# Patient Record
Sex: Male | Born: 1960 | Race: White | Hispanic: No | Marital: Married
Health system: Southern US, Community
[De-identification: ages and names within clinical notes are randomized; demographics above are authoritative.]

---

## 2018-04-27 ENCOUNTER — Encounter (HOSPITAL_COMMUNITY): Payer: Self-pay | Admitting: Internal Medicine

## 2018-04-27 ENCOUNTER — Emergency Department (HOSPITAL_COMMUNITY): Payer: BLUE CROSS/BLUE SHIELD

## 2018-04-27 ENCOUNTER — Emergency Department (HOSPITAL_COMMUNITY)
Admission: EM | Admit: 2018-04-27 | Discharge: 2018-04-27 | Disposition: A | Discharge status: Home / Self Care | Payer: BLUE CROSS/BLUE SHIELD | Attending: Emergency Medicine | Admitting: Emergency Medicine

## 2018-04-27 DIAGNOSIS — R21 Rash and other nonspecific skin eruption: Secondary | ICD-10-CM | POA: Diagnosis not present

## 2018-04-27 DIAGNOSIS — M79671 Pain in right foot: Secondary | ICD-10-CM | POA: Diagnosis present

## 2018-04-27 MED ORDER — INDOMETHACIN 50 MG PO CAPS: 50.0000 mg | ORAL_CAPSULE | Freq: Two times a day (BID) | ORAL | 0 refills | Status: AC

## 2018-04-27 MED ORDER — DOXYCYCLINE HYCLATE 100 MG PO CAPS: 100.0000 mg | ORAL_CAPSULE | Freq: Two times a day (BID) | ORAL | 0 refills | Status: AC

## 2018-04-27 MED ORDER — PREDNISONE 20 MG PO TABS: ORAL_TABLET | ORAL | 0 refills | Status: AC

## 2018-04-27 NOTE — ED Notes (Signed)
Patient transported to X-ray 

## 2018-04-27 NOTE — ED Triage Notes (Addendum)
Pt here from home after level fell from counter onto his right great toe area of his right foot about two weeks ago. Pt states that swelling and redness became significantly worse starting last night. Rates pain 9/10. Reports taking ibuprofen for symptom management without relief.

## 2018-04-27 NOTE — Discharge Instructions (Signed)
You have a recent injury to your right foot.  Your xray is normal.  The location of your pain could be gout, or infection.  Take medications prescribed and follow up with your doctor for further care.

## 2018-04-27 NOTE — ED Notes (Signed)
ED Provider at bedside. 

## 2018-04-27 NOTE — ED Notes (Signed)
Pt returned to room from xray.

## 2018-04-27 NOTE — ED Provider Notes (Signed)
[facility]   CSN: [id number] Arrival date & time: 04/27/18  [id number]     History   Chief Complaint No chief complaint on file.   HPI Ian LevansJohn Cabrera is a 57 y.o. male.  The history is provided by the patient. No language interpreter was used.     57 year old male presenting for evaluation of right foot pain.  Patient report approximately 2 weeks ago, a wooden level approximately 5 to 10 pounds slipped off a table and landed on his right foot.  He did report acute onset of sharp pain to his right foot from the impact but did not think much of it.  He has had recurrent pain since and for the past 24 hours the pain has intensified.  He now noticed redness and swelling to the affected area.  Pain is rated as 9 out of 10, sharp, nonradiating, persistent.  He denies any other injury.  He does not have any history of gout or diabetes.  He has been taking ibuprofen at home without relief.  History reviewed. No pertinent past medical history.  There are no active problems to display for this patient.   History reviewed. No pertinent surgical history.      Home Medications    Prior to Admission medications   Not on File    Family History No family history on file.  Social History Social History   Tobacco Use  . Smoking status: Not on file  Substance Use Topics  . Alcohol use: Not on file  . Drug use: Not on file     Allergies   Patient has no allergy information on record.   Review of Systems Review of Systems  Constitutional: Negative for fever.  Skin: Positive for rash. Negative for wound.  Neurological: Negative for numbness.     Physical Exam Updated Vital Signs BP 117/77 (BP Location: Right Arm)   Pulse 75   Temp 98 F (36.7 C) (Oral)   Resp 20   SpO2 96%   Physical Exam  Constitutional: He appears well-developed and well-nourished. No distress.  HENT:  Head: Atraumatic.  Eyes: Conjunctivae are  normal.  Neck: Neck supple.  Musculoskeletal: He exhibits tenderness (Right foot: Moderate edema erythema and warmth noted overlying the first MTP with tenderness to palpation.  No breaks in the skin.  Brisk cap refill throughout, no deformity, pedal pulse 2+.).  Neurological: He is alert.  Skin: No rash noted.  Psychiatric: He has a normal mood and affect.  Nursing note and vitals reviewed.    ED Treatments / Results  Labs (all labs ordered are listed, but only abnormal results are displayed) Labs Reviewed - No data to display  EKG None  Radiology Dg Foot Complete Right  Result Date: 04/27/2018 CLINICAL DATA:  Blunt trauma to the foot. Pain. Pain is greatest in the first metatarsal. EXAM: RIGHT FOOT COMPLETE - 3+ VIEW COMPARISON:  None. FINDINGS: There is no evidence of fracture or dislocation. There is no evidence of arthropathy or other focal bone abnormality. Soft tissues are unremarkable. IMPRESSION: Negative. Electronically Signed   By: Marin Robertshristopher  Mattern M.D.   On: 04/27/2018 09:54  xray reviewed and interpreted by me.  Procedures Procedures (including critical care time)  Medications Ordered in ED Medications - No data to display   Initial Impression / Assessment and Plan / ED Course  I have reviewed the triage vital signs and the nursing notes.  Pertinent labs & imaging results that  were available during my care of the patient were reviewed by me and considered in my medical decision making (see chart for details).     BP 117/77 (BP Location: Right Arm)   Pulse 75   Temp 98 F (36.7 C) (Oral)   Resp 20   SpO2 96%    Final Clinical Impressions(s) / ED Diagnoses   Final diagnoses:  Right foot pain    ED Discharge Orders        Ordered    doxycycline (VIBRAMYCIN) 100 MG capsule  2 times daily     04/27/18 1044    indomethacin (INDOCIN) 50 MG capsule  2 times daily with meals     04/27/18 1044    predniSONE (DELTASONE) 20 MG tablet     04/27/18 1044       9:28 AM Patient complaining of right foot pain.  He does have pain swelling redness overlying the first MTP.  He did report a prior injury when a 5 to 10 pounds wooden level landed on his right foot 2 weeks prior.  He does not have any history of gout.  Will obtain x-ray.  If negative, patient will be provided with symptom medic treatment for his pain as well as antibiotic for potential infection.  It is possible that this is gout related but he does not have any history of prior gout and in the setting of recent injury, it is less likely.  10:38 AM X-ray of the right foot without any concerning finding.  Patient will be discharged home with antibiotic for potential cellulitis however he will also receive prednisone, indomethacin and colchicine for potential gout.  He will follow-up with his PCP for further care.  Return precautions discussed.   Fayrene Helper, PA-C 04/27/18 1047    Tegeler, Canary Brim, MD 04/27/18 1550

## 2019-01-29 IMAGING — DX DG FOOT COMPLETE 3+V*R*
3 series · 3 of 3 positions shown · non-contrast
Comparison: None.

CLINICAL DATA: Blunt trauma to the foot. Pain. Pain is greatest in
the first metatarsal.

EXAM:
RIGHT FOOT COMPLETE - 3+ VIEW

[foot ap]
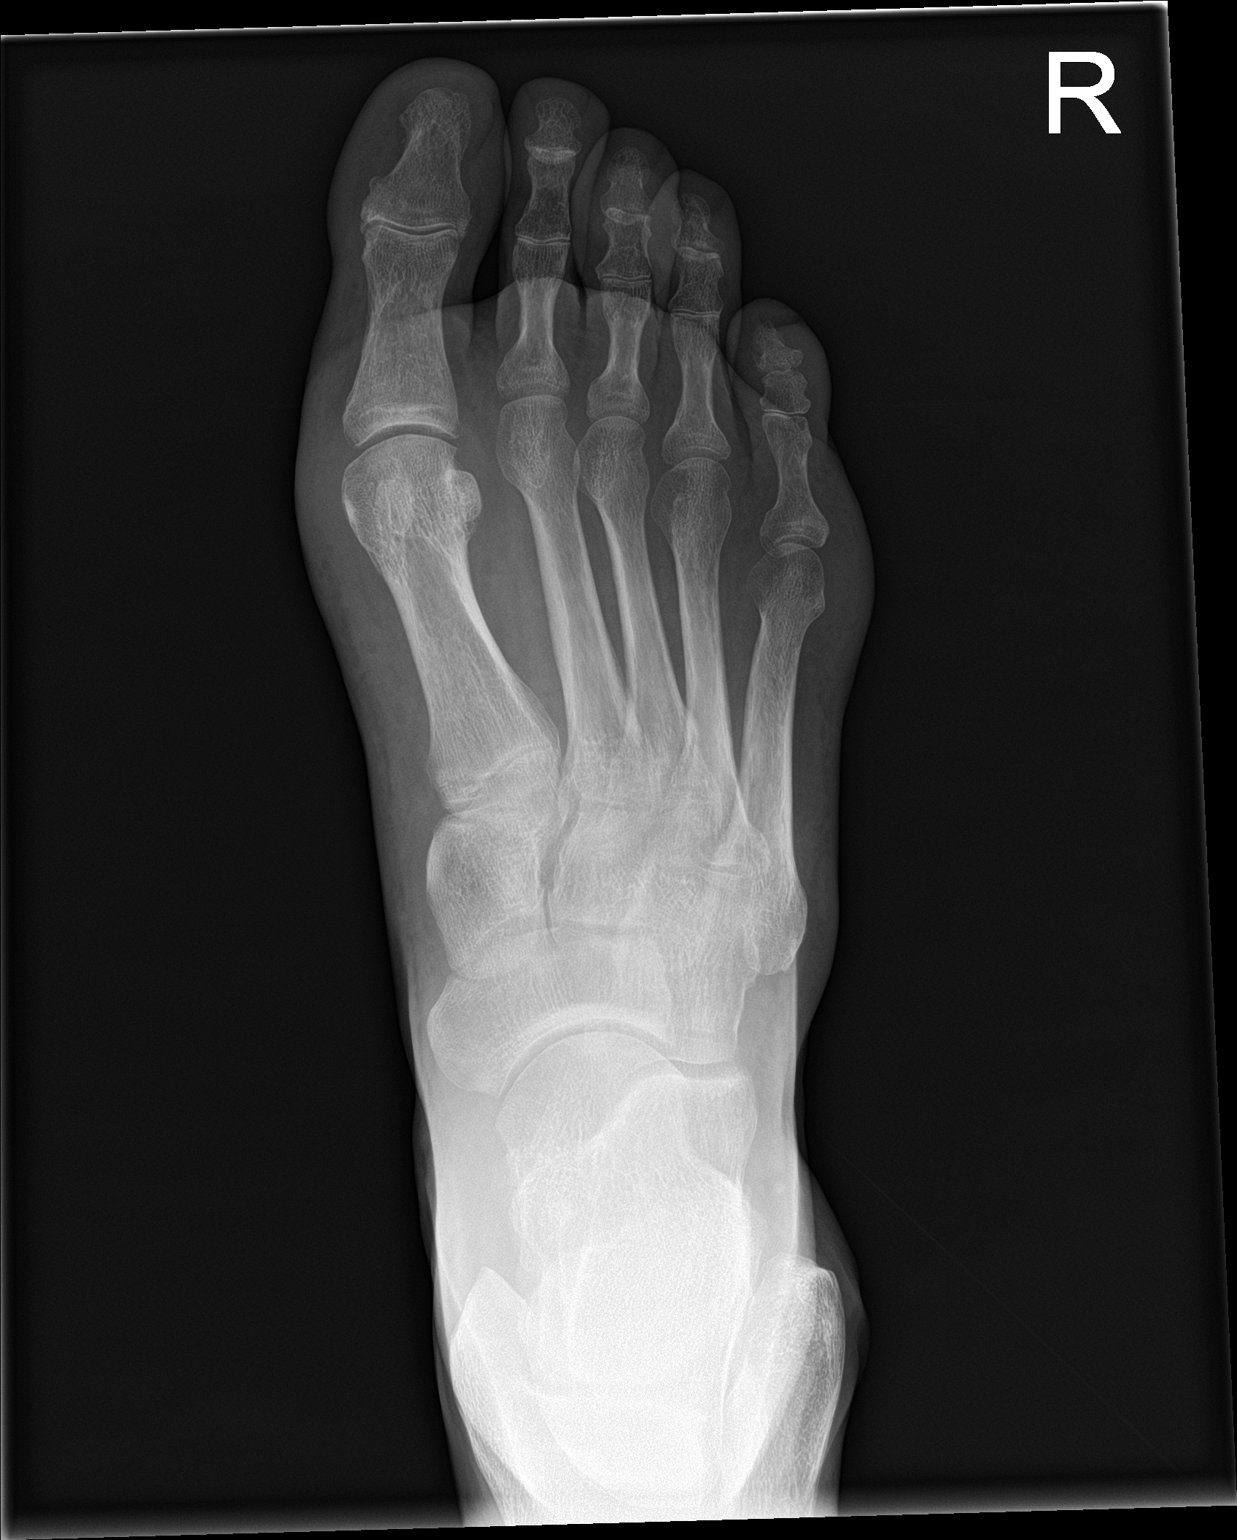

[foot obl]
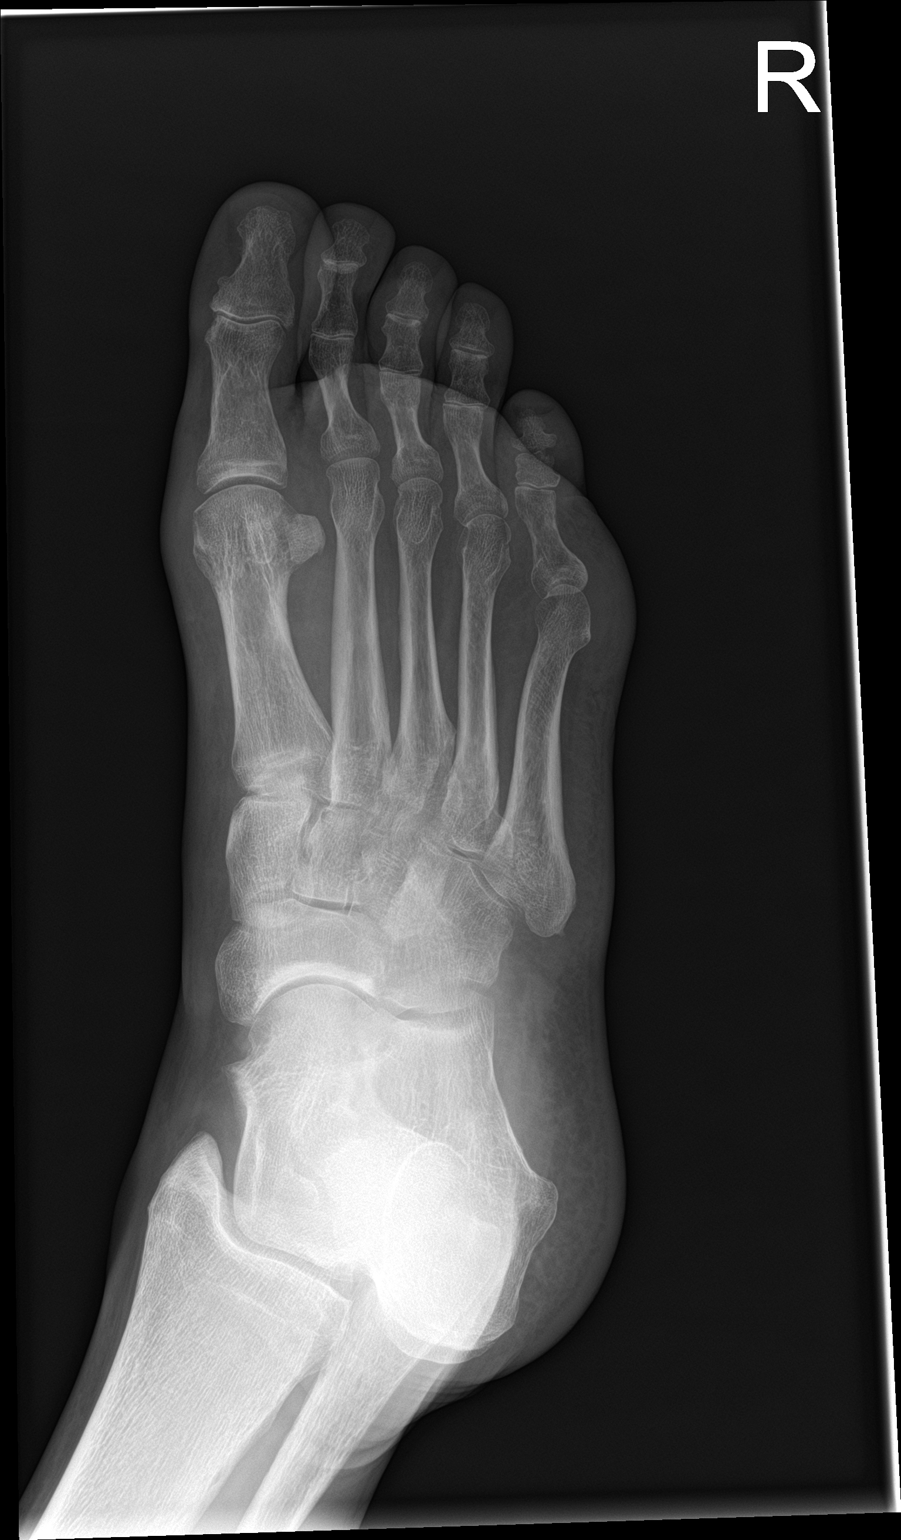

[foot lat]
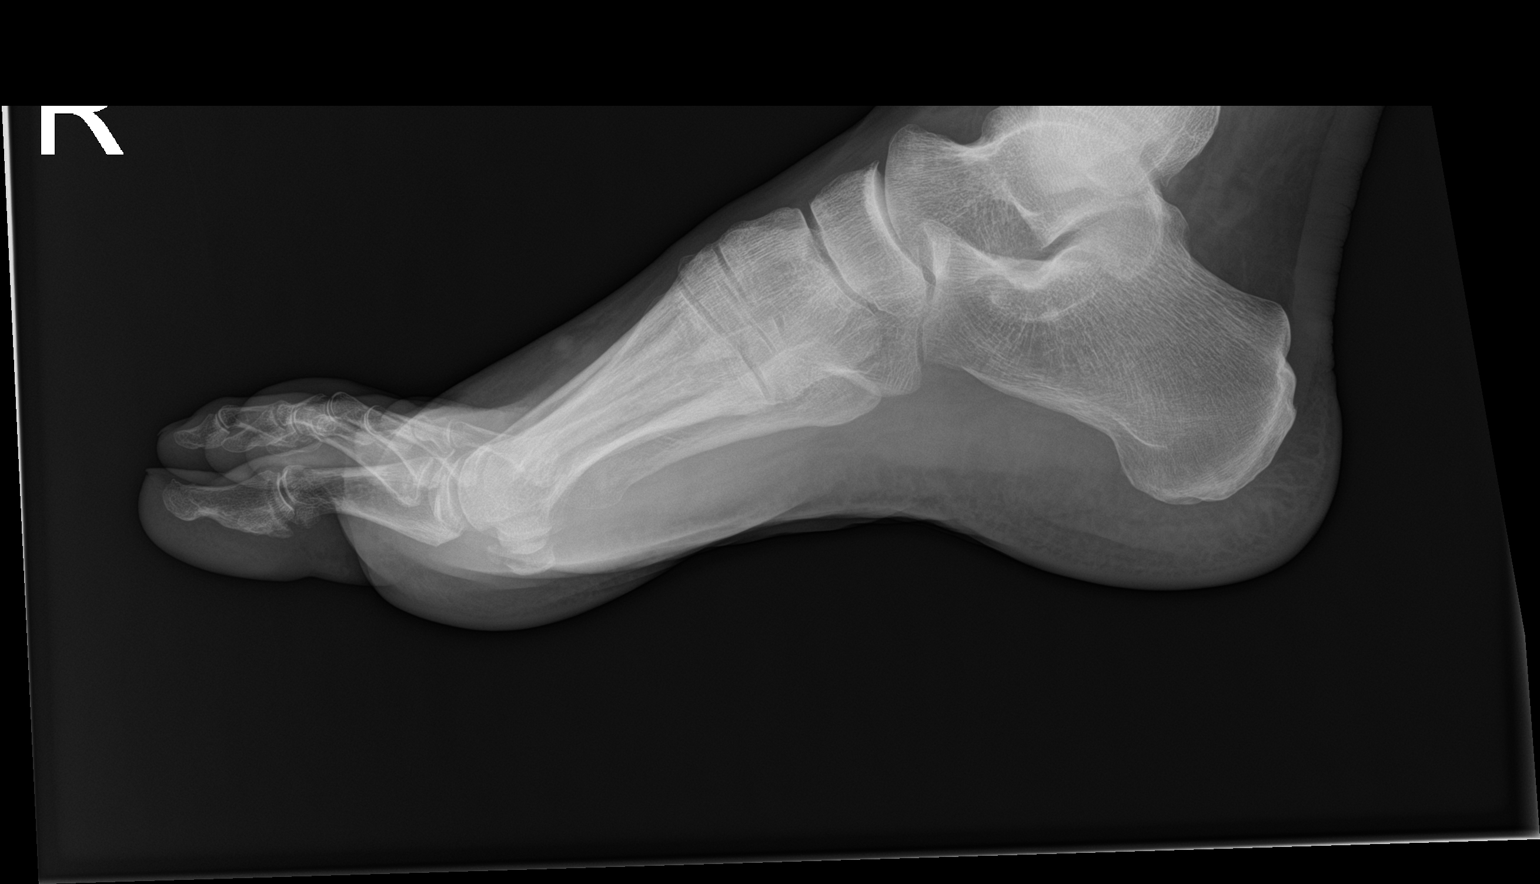

[3 of 3 positions shown; findings below may reference images not displayed]

FINDINGS: There is no evidence of fracture or dislocation. There is no
evidence of arthropathy or other focal bone abnormality. Soft
tissues are unremarkable.
IMPRESSION: Negative.
